# Patient Record
Sex: Female | Born: 2000 | Hispanic: No | Marital: Single | Smoking: Never smoker
Health system: Southern US, Community
[De-identification: ages and names within clinical notes are randomized; demographics above are authoritative.]

## PROBLEM LIST (undated history)

## (undated) HISTORY — PX: APPENDECTOMY: SHX54

---
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
## 2021-03-06 ENCOUNTER — Ambulatory Visit (LOCAL_COMMUNITY_HEALTH_CENTER): Payer: Self-pay

## 2021-03-06 ENCOUNTER — Other Ambulatory Visit: Payer: Self-pay

## 2021-03-06 DIAGNOSIS — Z7185 Encounter for immunization safety counseling: Secondary | ICD-10-CM

## 2021-03-06 NOTE — Progress Notes (Signed)
In Nurse Clinic today asking whether she needs vaccines for [facility]. 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 Per [facility] review, pt appears to be UTD with vaccines typically required. However, RN counseled pt to contact [facility] for specific vaccine requirements. RN offered Hep A (dose #2), HPV, and Flu vaccines today as these were missing from [facility]. Pt declines these vaccines today. [facility] copy given to pt  Jerel Shepherd, RN
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
